# Patient Record
Sex: Male | Born: 2005 | Hispanic: No | Marital: Single | Smoking: Never smoker
Health system: Southern US, Community
[De-identification: ages and names within clinical notes are randomized; demographics above are authoritative.]

## PROBLEM LIST (undated history)

## (undated) DIAGNOSIS — T7840XA Allergy, unspecified, initial encounter: Secondary | ICD-10-CM

## (undated) DIAGNOSIS — F419 Anxiety disorder, unspecified: Secondary | ICD-10-CM

## (undated) DIAGNOSIS — F840 Autistic disorder: Secondary | ICD-10-CM

## (undated) DIAGNOSIS — F909 Attention-deficit hyperactivity disorder, unspecified type: Secondary | ICD-10-CM

## (undated) DIAGNOSIS — K2960 Other gastritis without bleeding: Secondary | ICD-10-CM

## (undated) DIAGNOSIS — K589 Irritable bowel syndrome without diarrhea: Secondary | ICD-10-CM

## (undated) HISTORY — DX: Anxiety disorder, unspecified: F41.9

## (undated) HISTORY — DX: Irritable bowel syndrome, unspecified: K58.9

## (undated) HISTORY — PX: TONSILLECTOMY: SUR1361

## (undated) HISTORY — DX: Other gastritis without bleeding: K29.60

---
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
## 2023-01-02 ENCOUNTER — Encounter: Payer: Self-pay | Admitting: Cardiology

## 2023-01-02 ENCOUNTER — Ambulatory Visit (INDEPENDENT_AMBULATORY_CARE_PROVIDER_SITE_OTHER): Payer: MEDICAID

## 2023-01-02 ENCOUNTER — Ambulatory Visit: Payer: MEDICAID | Attending: Physician Assistant | Admitting: Cardiology

## 2023-01-02 VITALS — BP 128/76 | HR 63 | Ht 69.0 in | Wt 147.4 lb

## 2023-01-02 DIAGNOSIS — F17290 Nicotine dependence, other tobacco product, uncomplicated: Secondary | ICD-10-CM

## 2023-01-02 DIAGNOSIS — R002 Palpitations: Secondary | ICD-10-CM

## 2023-01-02 DIAGNOSIS — R079 Chest pain, unspecified: Secondary | ICD-10-CM

## 2023-01-02 NOTE — Patient Instructions (Signed)
Medication Instructions:  Continue same medications *If you need a refill on your cardiac medications before your next appointment, please call your pharmacy*   Lab Work: None ordered   Testing/Procedures: 7 day Heart Monitor  ( ZIO )   Hold Metoprolol while wearing monitor   Follow-Up: At [facility], you and your health needs are our priority.  As part of our continuing mission to provide you with exceptional heart care, we have created designated Provider Care Teams.  These Care Teams include your primary Cardiologist (physician) and Advanced Practice Providers (APPs -  Physician Assistants and Nurse Practitioners) who all work together to provide you with the care you need, when you need it.  We recommend signing up for the patient portal called "MyChart".  Sign up information is provided on this After Visit Summary.  MyChart is used to connect with patients for Virtual Visits (Telemedicine).  Patients are able to view lab/test results, encounter notes, upcoming appointments, etc.  Non-urgent messages can be sent to your provider as well.   To learn more about what you can do with MyChart, go to [email or web address].    Your next appointment:  6 months     Call in Nov to schedule Feb appointment    Provider:  Dr.Schumann

## 2023-01-02 NOTE — Progress Notes (Signed)
Cardiology Office Note:    Date:  01/02/2023   ID:  Ricardo Hopkins, DOB Jun 30, 2005, MRN [id number]  PCP:  Lianne Moris, PA-C  Cardiologist:  None  Electrophysiologist:  None   Referring MD: Lianne Moris, PA-C   No chief complaint on file.   History of Present Illness:    Ricardo Hopkins is a 17 y.o. male with a hx of ADHD, anxiety, IBS who presents for follow-up.  He was seen in the ED on 12/18/2021 with palpitations.  Occurred after smoking marijuana.  ED work-up unremarkable.  He reports his heart rate was up to 180s.  States that he had palpitations that lasted for about 30 minutes.  Reports palpitations occur occasionally.  Also describes chest pain, he describes as left-sided sharp pain, that lasts for about 1 second and can occur multiple times per day.  No clear cause.  Does report worse with taking deep breaths.  Does not exercise but reports shortness of breath with exertion.  Reports lightheadedness but denies any syncope.  No lower extremity edema.  Reports that he vapes.  Family history includes brother had SVT ablation in 62s.  Echocardiogram 12/2021 showed EF 50 to 55%, normal RV function, no significant valvular disease.  Zio patch x 14 days 12/2021 showed no significant arrhythmias.  Since last clinic visit, he was in the ED 11/2022 with chest pain.  Workup including troponins were negative.  Reports has been having episodes every day where he feels squeezing pain on left side of chest and radiates to the jaw.  Also has palpitations during episodes.  Happens at rest, not related to exercise.  Reports sometimes he will work out doing push-ups and denies any exertional symptoms.  Reports symptoms are worse than when he wore his monitor last year.  PCP started on metoprolol, states that it has helped some.  He continues to vape.   Past Medical History:  Diagnosis Date   ADHD    Allergy    Anxiety    panic attacks   Autism    IBS (irritable bowel syndrome)    Reflux  gastritis     Past Surgical History:  Procedure Laterality Date   TONSILLECTOMY      Current Medications: Current Meds  Medication Sig   metoprolol tartrate (LOPRESSOR) 25 MG tablet Take 12.5 mg by mouth 2 (two) times daily.     Allergies:   Betadine [povidone iodine]   Social History   Socioeconomic History   Marital status: Single    Spouse name: Not on file   Number of children: Not on file   Years of education: Not on file   Highest education level: Not on file  Occupational History   Not on file  Tobacco Use   Smoking status: Never    Passive exposure: Yes   Smokeless tobacco: Never  Vaping Use   Vaping status: Some Days  Substance and Sexual Activity   Alcohol use: No   Drug use: No   Sexual activity: Never  Other Topics Concern   Not on file  Social History Narrative   Not on file   Social Determinants of Health   Financial Resource Strain: Not on file  Food Insecurity: Not on file  Transportation Needs: Not on file  Physical Activity: Not on file  Stress: Not on file  Social Connections: Not on file     Family History: The patient's family history includes ADD / ADHD in an other family member; Aneurysm in his  paternal grandmother; Autism in his brother and brother; Cancer in his maternal grandfather; Depression in his maternal grandmother, mother, and another family member; Diabetes in his mother; Mental retardation in his brother and brother; Supraventricular tachycardia in an other family member.  ROS:   Please see the history of present illness.     All other systems reviewed and are negative.  EKGs/Labs/Other Studies Reviewed:    The following studies were reviewed today:   EKG:   01/06/2022: Normal sinus rhythm, rate 89, no ST abnormalities  Recent Labs: 12/15/2022: TSH 1.512 12/24/2022: BUN 9; Creatinine, Ser 0.84; Hemoglobin 15.6; Platelets 346; Potassium 3.6; Sodium 140  Recent Lipid Panel No results found for: "CHOL", "TRIG", "HDL",  "CHOLHDL", "VLDL", "LDLCALC", "LDLDIRECT"  Physical Exam:    VS:  BP 128/76   Pulse 63   Ht 5\' 9"  (1.753 m)   Wt 147 lb 6.4 oz (66.9 kg)   SpO2 97%   BMI 21.77 kg/m     Wt Readings from Last 3 Encounters:  01/02/23 147 lb 6.4 oz (66.9 kg) (39%, Z= -0.28)*  12/24/22 145 lb (65.8 kg) (35%, Z= -0.39)*  12/15/22 145 lb (65.8 kg) (35%, Z= -0.38)*   * Growth percentiles are based on CDC (Boys, 2-20 Years) data.     GEN:  Well nourished, well developed in no acute distress HEENT: Normal NECK: No JVD; No carotid bruits LYMPHATICS: No lymphadenopathy CARDIAC: RRR, no murmurs, rubs, gallops RESPIRATORY:  Clear to auscultation without rales, wheezing or rhonchi  ABDOMEN: Soft, non-tender, non-distended MUSCULOSKELETAL:  No edema; No deformity  SKIN: Warm and dry NEUROLOGIC:  Alert and oriented x 3 PSYCHIATRIC:  Normal affect   ASSESSMENT:    1. Palpitations   2. Chest pain of uncertain etiology   3. Vaping nicotine dependence, tobacco product     PLAN:    Palpitations: Echocardiogram 12/2021 showed EF 50 to 55%, normal RV function, no significant valvular disease.  Zio patch x 14 days 12/2021 showed no significant arrhythmias. -Having worsening symptoms recently, will repeat Zio patch x 7 days  Chest pain: Low suspicion for cardiac etiology given age and lack of risk factors, and description of pain with no relationship with exertion.  Does report pleuritic component, ESR/CRP were normal.  No further cardiac workup recommended at this time  Vaping: Recommend cessation  RTC in 6 months   Medication Adjustments/Labs and Tests Ordered: Current medicines are reviewed at length with the patient today.  Concerns regarding medicines are outlined above.  Orders Placed This Encounter  Procedures   LONG TERM MONITOR (3-14 DAYS)   No orders of the defined types were placed in this encounter.   Patient Instructions  Medication Instructions:  Continue same medications *If you  need a refill on your cardiac medications before your next appointment, please call your pharmacy*   Lab Work: None ordered   Testing/Procedures: 7 day Heart Monitor  ( ZIO )   Hold Metoprolol while wearing monitor   Follow-Up: At [facility], you and your health needs are our priority.  As part of our continuing mission to provide you with exceptional heart care, we have created designated Provider Care Teams.  These Care Teams include your primary Cardiologist (physician) and Advanced Practice Providers (APPs -  Physician Assistants and Nurse Practitioners) who all work together to provide you with the care you need, when you need it.  We recommend signing up for the patient portal called "MyChart".  Sign up information is provided on this  After Visit Summary.  MyChart is used to connect with patients for Virtual Visits (Telemedicine).  Patients are able to view lab/test results, encounter notes, upcoming appointments, etc.  Non-urgent messages can be sent to your provider as well.   To learn more about what you can do with MyChart, go to [email or web address].    Your next appointment:  6 months     Call in Nov to schedule Feb appointment    Provider:  Dr.Nesha Counihan     Signed, Little Ishikawa, MD  01/02/2023 9:05 AM    [facility]

## 2023-01-02 NOTE — Progress Notes (Unsigned)
Enrolled patient for a 7 day Zio XT monitor to be mailed to patients home.  

## 2023-01-08 DIAGNOSIS — R002 Palpitations: Secondary | ICD-10-CM

## 2023-01-15 ENCOUNTER — Ambulatory Visit: Payer: MEDICAID | Admitting: Physician Assistant

## 2023-02-19 ENCOUNTER — Encounter: Payer: Self-pay | Admitting: Internal Medicine

## 2023-02-19 ENCOUNTER — Ambulatory Visit (INDEPENDENT_AMBULATORY_CARE_PROVIDER_SITE_OTHER): Payer: MEDICAID | Admitting: Internal Medicine

## 2023-02-19 VITALS — BP 123/81 | HR 81 | Ht 69.0 in | Wt 146.0 lb

## 2023-02-19 DIAGNOSIS — R0789 Other chest pain: Secondary | ICD-10-CM | POA: Diagnosis not present

## 2023-02-19 DIAGNOSIS — R0609 Other forms of dyspnea: Secondary | ICD-10-CM

## 2023-02-19 NOTE — Patient Instructions (Signed)
Classic  pain pattern suggests ibs:   migratory with a very limited distribution of pain locations, daytime, not usually exacerbated by exercise  or coughing, worse in sitting position, not as likely to be present supine due to the dome effect of the diaphragm which  is  canceled in that position. Frequently these patients have had multiple negative GI workups and CT scans.  Treatment consists of avoiding foods that cause gas (especially boiled eggs, mexcican food but especially  beans and undercooked vegetables like  spinach and salads)  and citrucel 1 heaping tbsp twice daily with a large glass of water.  Pain should improve w/in 2 weeks and if not then consider further work up.     To get the most out of exercise, you need to be continuously aware that you are short of breath, but never out of breath, for at least 30 minutes daily. As you improve, it will actually be easier for you to do the same amount of exercise  in  30 minutes so always push to the level where you are short of breath.  Once you can do this, push for longer duration.   Follow up is as needed in 6 weeks

## 2023-02-19 NOTE — Progress Notes (Signed)
Kaynin Brazill, male    DOB: 15-Jul-2005    MRN: [id number]   Brief patient profile:  17   yo male stopped vaping aug 2024 very healthy until COVID 2021 with sob/cough/ palpitations > eval neg by cards with atypical cp  referred to pulmonary clinic in [facility]  02/19/2023 by Lianne Moris  for cp and doe.   History of Present Illness  02/19/2023  Pulmonary/ 1st office eval/ Peniel Biel / [place] [facility]  Chief Complaint  Patient presents with   Establish Care  Dyspnea:  maybe jogging 5-10 min no assoc cp  Cough: none  Sleep: bed is flat / one pillow /  SABA use: none / didn't help  02: none CP is always anterior most commonly above L breast, from secs to 15 min max, not brought on by cough ,deep breath or ex and more likely sitting and not present supine x ever since covid.  No obvious day to day or daytime pattern/variability or assoc excess/ purulent sputum or mucus plugs or hemoptysis or   subjective wheeze or overt sinus or hb symptoms.    Also denies any obvious fluctuation of symptoms with weather or environmental changes or other aggravating or alleviating factors except as outlined above   No unusual exposure hx or h/o childhood pna/ asthma   Current Allergies, Complete Past Medical History, Past Surgical History, Family History, and Social History were reviewed in [employer] record.  ROS  The following are not active complaints unless bolded Hoarseness, sore throat, dysphagia, dental problems, itching, sneezing,  nasal congestion or discharge of excess mucus or purulent secretions, ear ache,   fever, chills, sweats, unintended wt loss or wt gain, classically pleuritic or exertional cp,  orthopnea pnd or arm/hand swelling  or leg swelling, presyncope, palpitations, abdominal pain, anorexia, nausea, vomiting, diarrhea  or change in bowel habits or change in bladder habits, change in stools or change in urine, dysuria, hematuria,  rash, arthralgias, visual  complaints, headache, numbness, weakness or ataxia or problems with walking or coordination,  change in mood or  memory.            Outpatient Medications Prior to Visit  Medication Sig Dispense Refill   sertraline (ZOLOFT) 25 MG tablet Take 25 mg by mouth daily.     metoprolol tartrate (LOPRESSOR) 25 MG tablet Take 12.5 mg by mouth 2 (two) times daily.     famotidine (PEPCID) 20 MG tablet Take 1 tablet (20 mg total) by mouth 2 (two) times daily. (Patient not taking: Reported on 01/06/2022) 30 tablet 0   FLUoxetine (PROZAC) 20 MG capsule Take 1 capsule (20 mg total) by mouth daily. (Patient not taking: Reported on 01/06/2022) 30 capsule 0   hydrOXYzine (ATARAX/VISTARIL) 25 MG tablet Take 1 tablet (25 mg total) by mouth daily as needed for anxiety. (Patient not taking: Reported on 01/06/2022) 30 tablet 0   melatonin 3 MG TABS tablet Take 3 mg by mouth at bedtime. (Patient not taking: Reported on 01/06/2022)     naproxen (NAPROSYN) 500 MG tablet Take 1 tablet (500 mg total) by mouth 2 (two) times daily. (Patient not taking: Reported on 01/02/2023) 30 tablet 0   omeprazole (PRILOSEC) 20 MG capsule Take 1 capsule (20 mg total) by mouth daily. (Patient not taking: Reported on 01/02/2023) 30 capsule 0   Probiotic Product ([employer] ADV PROBIOTIC GUMMIES PO) Take 2 each by mouth daily. (Patient not taking: Reported on 01/06/2022)     sertraline (ZOLOFT) 50 MG  tablet Take 50 mg by mouth daily. (Patient not taking: Reported on 01/06/2022)     No facility-administered medications prior to visit.    Past Medical History:  Diagnosis Date   ADHD    Allergy    Anxiety    panic attacks   Autism    IBS (irritable bowel syndrome)    Reflux gastritis      Lab Results  Component Value Date   HGB 15.6 12/24/2022   HGB 15.3 12/15/2022   HGB 14.3 08/12/2019    D dierm      < 0.27    12/25/22     Lab Results  Component Value Date   ESRSEDRATE 2 01/06/2022    Lab Results  Component Value Date   TSH 1.512  12/15/2022     Objective:     BP 123/81   Pulse 81   Ht 5\' 9"  (1.753 m)   Wt 146 lb (66.2 kg)   SpO2 99%   BMI 21.56 kg/m   SpO2: 99 % RA   Pleasant amb male nad   HEENT : Oropharynx  clear      Nasal turbinates nl    NECK :  without  apparent JVD/ palpable Nodes/TM    LUNGS: no acc muscle use,  Nl contour chest which is clear to A and P bilaterally without cough on insp or exp maneuvers   CV:  RRR  no s3 or murmur or increase in P2, and no edema   ABD:  soft and nontender with nl inspiratory excursion in the supine position. No bruits or organomegaly appreciated   MS:  Nl gait/ ext warm without deformities Or obvious joint restrictions  calf tenderness, cyanosis or clubbing    SKIN: warm and dry without lesions    NEURO:  alert, approp, nl sensorium with  no motor or cerebellar deficits apparent.     I personally reviewed images and agree with radiology impression as follows:  CXR:   12/24/22 No active cardiopulmonary disease.     Assessment   DOE (dyspnea on exertion) Onset with covid 2021  - Echo nl 01/14/22  - stopped vaping Aug 2024  - 02/19/2023   Walked on RA  x  3  lap(s) =  approx 450  ft  @ mod fast  pace, stopped due to end of study with lowest 02 sats 92% and no sob  with sats improving to 93% prior to stopping  Ex sats are a bit low for his age and I suppose he could have mild subclinical post covid PF or unusual vape assoc ILD  so rec:  To get the most out of exercise, you need to be continuously aware that you are short of breath, but never out of breath, for at least 30 minutes daily. As you improve, it will actually be easier for you to do the same amount of exercise  in  30 minutes so always push to the level where you are short of breath.  Once you can do this, push for longer duration.  Make sure you check your oxygen saturations at highest level of activity    F/u in 6 weeks if not improving/  consider HRCT chest if not and continue off  vapes in meantime.  Atypical chest pain Onset with covid 2021 with classic IBS pattern of migrating ant cp always sitting, not with ex or supine - Rx for IBS  02/19/2023 >>>  Classic  pain pattern suggests ibs:  Stereotypical,  migratory with a very limited distribution of pain locations, daytime, not usually exacerbated by exercise  or coughing, worse in sitting position, frequently associated with generalized abd bloating, not as likely to be present supine due to the dome effect of the diaphragm which  is  canceled in that position. Frequently these patients have had multiple negative GI workups and CT scans.  Treatment consists of avoiding foods that cause gas (especially boiled eggs, mexcican food but especially  beans and undercooked vegetables like  spinach and some salads)  and citrucel 1 heaping tsp twice daily with a large glass of water.  Pain should improve w/in 2 weeks and if not then consider further  work up.     >>> f/u in 6 weeks if not better.   Discussed in detail all the  indications, usual  risks and alternatives  relative to the benefits with patient who agrees to proceed with Rx as outlined.      Each maintenance medication was reviewed in detail including emphasizing most importantly the difference between maintenance and prns and under what circumstances the prns are to be triggered using an action plan format where appropriate.  Total time for H and P, chart review, counseling,  directly observing portions of ambulatory 02 saturation study/ and generating customized AVS unique to this office visit / same day charting > 60 min for new pt with multiple chronic  refractory chest   symptoms of uncertain etiology                    Sandrea Hughs, MD 02/19/2023

## 2023-02-20 ENCOUNTER — Encounter: Payer: Self-pay | Admitting: Internal Medicine

## 2023-02-20 DIAGNOSIS — R0609 Other forms of dyspnea: Secondary | ICD-10-CM | POA: Insufficient documentation

## 2023-02-20 NOTE — Assessment & Plan Note (Addendum)
Onset with covid 2021  - Echo nl 01/14/22  - stopped vaping Aug 2024  - 02/19/2023   Walked on RA  x  3  lap(s) =  approx 450  ft  @ mod fast  pace, stopped due to end of study with lowest 02 sats 92% and no sob  with sats improving to 93% prior to stopping  Ex sats are a bit low for his age and I suppose he could have mild subclinical post covid PF or unusual vape assoc ILD  so rec:  To get the most out of exercise, you need to be continuously aware that you are short of breath, but never out of breath, for at least 30 minutes daily. As you improve, it will actually be easier for you to do the same amount of exercise  in  30 minutes so always push to the level where you are short of breath.  Once you can do this, push for longer duration.  Make sure you check your oxygen saturations at highest level of activity    F/u in 6 weeks if not improving/  consider HRCT chest if not and continue off vapes in meantime.

## 2023-02-20 NOTE — Assessment & Plan Note (Addendum)
Onset with covid 2021 with classic IBS pattern of migrating ant cp always sitting, not with ex or supine - Rx for IBS  02/19/2023 >>>  Classic  pain pattern suggests ibs:  Stereotypical, migratory with a very limited distribution of pain locations, daytime, not usually exacerbated by exercise  or coughing, worse in sitting position, frequently associated with generalized abd bloating, not as likely to be present supine due to the dome effect of the diaphragm which  is  canceled in that position. Frequently these patients have had multiple negative GI workups and CT scans.  Treatment consists of avoiding foods that cause gas (especially boiled eggs, mexcican food but especially  beans and undercooked vegetables like  spinach and some salads)  and citrucel 1 heaping tsp twice daily with a large glass of water.  Pain should improve w/in 2 weeks and if not then consider further  work up.     >>> f/u in 6 weeks if not better.   Discussed in detail all the  indications, usual  risks and alternatives  relative to the benefits with patient who agrees to proceed with Rx as outlined.      Each maintenance medication was reviewed in detail including emphasizing most importantly the difference between maintenance and prns and under what circumstances the prns are to be triggered using an action plan format where appropriate.  Total time for H and P, chart review, counseling,  directly observing portions of ambulatory 02 saturation study/ and generating customized AVS unique to this office visit / same day charting > 60 min for new pt with multiple chronic  refractory chest   symptoms of uncertain etiology

## 2023-03-08 NOTE — Progress Notes (Unsigned)
Cardiology Office Note    Date:  03/11/2023  ID:  Ricardo Hopkins, DOB Oct 08, 2005, MRN [id number] PCP:  Ricardo Moris, PA-C  Cardiologist:  Ricardo Ishikawa, MD  Electrophysiologist:  None   Chief Complaint: Follow up for palpitations   History of Present Illness: .    Ricardo Hopkins is a 17 y.o. male with visit-pertinent history of ADHD, anxiety, IBS, palpitations and atypical chest pain.  First seen by Dr. Gaynelle Hopkins on 01/06/2022 for ED follow-up for palpitations.  ED workup was unremarkable.  He reported having palpitations after smoking marijuana, reported his heart rate was up into the 180s and lasted for about 30 minutes.  He reported that he did occasionally have palpitations.  He also described chest pain that was left-sided and sharp lasting for about 1 second and could occur multiple times a day.  Echo on 01/14/2022 indicated LVEF 50 to 55%, no RWMA, diastolic parameters were normal, he had mild tricuspid valve regurgitation.  No other significant valvular abnormalities.  He wore a cardiac monitor in 12/2021 that showed an average heart rate of 91 bpm, ranging from 31 to 199 bpm.  Predominant underlying rhythm was sinus rhythm, second-degree AV block type I was present, isolated SVE's were rare, SVE couplets were rare.  There were 34 patient triggered events, corresponding with sinus rhythm and PACs.   He presented again to the ED in 11/2022 with chest pain, workup including troponins were negative.  He was seen in clinic by Ricardo Hopkins, patient reporting having episodes daily of squeezing left-sided chest pain that radiated into the jaw.  He also reported palpitations during these episodes.  Would happen at rest not related to exercise.  He noted that his symptoms were worse than when he wore his monitor the year prior, his PCP had started him on metoprolol which helped some.  It was noted that he continued to vape.  Repeat cardiac monitor that was worn for 7 days showed an  average heart rate of 80 bpm, ranging from 46 to 178 bpm.  Predominant underlying rhythm was sinus rhythm, SVE, SVE triplets were rare, no SVE couplets were present, isolated VE's were rare.  There were 104 patient triggered events, corresponding with sinus rhythm and PACs.  He was seen by Dr. Sherene Hopkins on 02/19/2023 after referral from his PCP.  It was noted that he had COVID in 2021, stopped vaping in August 2024.  He did a walk test walking 450 feet at a moderately fast pace, stopped due to end of study with lowest oxygen saturation at 92% and no shortness of breath with saturations improving to 93% prior to stopping.  It was noted that his sats are a bit low for his age, could possibly have mild subclinical post COVID PF or unusual vape associated ILD.  It was recommended that he increase his activity as tolerated and to monitor oxygen saturation at highest level of activity. On chart review he also had an exercise treadmill test showed no significant ST segment changes during stress or recovery, no arrhythmias were noted, ECG was negative for ischemia. He did have poor exercise capacity for his age, reached peak heart rate of 181 bpm.   Today he presents for follow up. He reports that his shortness of breath and palpitations have improved since he stopped vaping a few weeks ago. He does endorse ongoing intermittent left chest chest pain and arm pain, not associated with exertion and is overall unchanged. He reports new intermittent facial numbness,  hand and feet numbness with occasional tingling and episodes of brain fog.  He reports that he is no longer on antianxiety medication as he felt like it was not beneficial.   Labwork independently reviewed: 12/24/2022: Sodium 140, potassium 3.6, creatinine 0.84, high-sensitivity troponin less than 2, hemoglobin 15.6, hematocrit 44.3 ROS: .   Today he denies lower extremity edema, fatigue, melena, hematuria, hemoptysis, diaphoresis, weakness, presyncope, syncope,  orthopnea, and PND.  All other systems are reviewed and otherwise negative. Studies Reviewed: [name] Kitchen   EKG:  EKG is not ordered today.  CV Studies:  Cardiac Studies & Procedures       ECHOCARDIOGRAM  ECHOCARDIOGRAM COMPLETE 01/14/2022  Narrative ECHOCARDIOGRAM REPORT    Patient Name:   Ricardo Hopkins Date of Exam: 01/14/2022 Medical Rec #:  [id number]           Height:       69.0 in Accession #:    [id number]          Weight:       138.8 lb Date of Birth:  09/01/2005           BSA:          1.769 m Patient Age:    16 years            BP:           144/86 mmHg Patient Gender: M                   HR:           78 bpm. Exam Location:  [facility]  Procedure: 2D Echo, Cardiac Doppler and Color Doppler  Indications:    R00.2 (ICD-10-CM) - Palpitations  History:        Patient has no prior history of Echocardiogram examinations. Risk Factors:Vapes. Hx of COVID-19.  Sonographer:    Celesta Gentile RCS Referring Phys: [id number] Ricardo Hopkins  IMPRESSIONS   1. Left ventricular ejection fraction, by estimation, is 50 to 55%. The left ventricle has low normal function. The left ventricle has no regional wall motion abnormalities. Left ventricular diastolic parameters were normal. 2. Right ventricular systolic function is normal. The right ventricular size is normal. There is normal pulmonary artery systolic pressure. 3. The mitral valve is normal in structure. No evidence of mitral valve regurgitation. No evidence of mitral stenosis. 4. The tricuspid valve is abnormal. 5. The aortic valve is tricuspid. Aortic valve regurgitation is not visualized. No aortic stenosis is present. 6. The inferior vena cava is normal in size with greater than 50% respiratory variability, suggesting right atrial pressure of 3 mmHg.  FINDINGS Left Ventricle: Left ventricular ejection fraction, by estimation, is 50 to 55%. The left ventricle has low normal function. The left ventricle has no regional  wall motion abnormalities. The left ventricular internal cavity size was normal in size. There is no left ventricular hypertrophy. Left ventricular diastolic parameters were normal.  Right Ventricle: The right ventricular size is normal. Right vetricular wall thickness was not well visualized. Right ventricular systolic function is normal. There is normal pulmonary artery systolic pressure. The tricuspid regurgitant velocity is 2.13 m/s, and with an assumed right atrial pressure of 3 mmHg, the estimated right ventricular systolic pressure is 21.1 mmHg.  Left Atrium: Left atrial size was normal in size.  Right Atrium: Right atrial size was normal in size.  Pericardium: There is no evidence of pericardial effusion.  Mitral Valve: The mitral valve is normal in structure.  No evidence of mitral valve regurgitation. No evidence of mitral valve stenosis.  Tricuspid Valve: The tricuspid valve is abnormal. Tricuspid valve regurgitation is mild . No evidence of tricuspid stenosis.  Aortic Valve: The aortic valve is tricuspid. Aortic valve regurgitation is not visualized. No aortic stenosis is present. Aortic valve mean gradient measures 4.0 mmHg. Aortic valve peak gradient measures 7.7 mmHg. Aortic valve area, by VTI measures 3.51 cm.  Pulmonic Valve: The pulmonic valve was not well visualized. Pulmonic valve regurgitation is not visualized. No evidence of pulmonic stenosis.  Aorta: The aortic root is normal in size and structure.  Venous: The inferior vena cava is normal in size with greater than 50% respiratory variability, suggesting right atrial pressure of 3 mmHg.  IAS/Shunts: No atrial level shunt detected by color flow Doppler.   LEFT VENTRICLE PLAX 2D LVIDd:         5.00 cm   Diastology LVIDs:         3.40 cm   LV e' medial:    14.30 cm/s LV PW:         0.80 cm   LV E/e' medial:  6.9 LV IVS:        0.70 cm   LV e' lateral:   17.40 cm/s LVOT diam:     2.30 cm   LV E/e' lateral:  5.7 LV SV:         93 LV SV Index:   53 LVOT Area:     4.15 cm   RIGHT VENTRICLE RV S prime:     12.80 cm/s TAPSE (M-mode): 1.8 cm  LEFT ATRIUM             Index        RIGHT ATRIUM           Index LA diam:        3.50 cm 1.98 cm/m   RA Area:     10.30 cm LA Vol (A2C):   45.0 ml 25.44 ml/m  RA Volume:   19.50 ml  11.02 ml/m LA Vol (A4C):   34.6 ml 19.56 ml/m LA Biplane Vol: 39.7 ml 22.44 ml/m AORTIC VALVE AV Area (Vmax):    3.35 cm AV Area (Vmean):   3.35 cm AV Area (VTI):     3.51 cm AV Vmax:           138.99 cm/s AV Vmean:          93.435 cm/s AV VTI:            0.266 m AV Peak Grad:      7.7 mmHg AV Mean Grad:      4.0 mmHg LVOT Vmax:         112.00 cm/s LVOT Vmean:        75.400 cm/s LVOT VTI:          0.225 m LVOT/AV VTI ratio: 0.84  AORTA Ao Root diam: 3.00 cm  MITRAL VALVE               TRICUSPID VALVE MV Area (PHT): 3.72 cm    TR Peak grad:   18.1 mmHg MV Decel Time: 204 msec    TR Vmax:        213.00 cm/s MV E velocity: 99.00 cm/s MV A velocity: 55.60 cm/s  SHUNTS MV E/A ratio:  1.78        Systemic VTI:  0.22 m Systemic Diam: 2.30 cm  Dina Rich MD Electronically signed by Dina Rich MD  Signature Date/Time: 01/14/2022/12:57:20 PM    Final    MONITORS  LONG TERM MONITOR (3-14 DAYS) 01/21/2023  Narrative   No significant arrhythmias   Patch Wear Time:  7 days and 1 hours (2024-09-12T23:06:49-0400 to 2024-09-20T00:07:38-0400)  Patient had a min HR of 46 bpm, max HR of 178 bpm, and avg HR of 80 bpm. Predominant underlying rhythm was Sinus Rhythm. Isolated SVEs were rare (<1.0%), SVE Triplets were rare (<1.0%), and no SVE Couplets were present. Isolated VEs were rare (<1.0%), and no VE Couplets or VE Triplets were present.  104 patient triggered events, corresponding sinus rhythm  PACs            Current Reported Medications:.    No outpatient medications have been marked as taking for the 03/11/23 encounter (Office Visit) with  Rip Harbour, NP.   Physical Exam:    VS:  BP 102/60 (BP Location: Left Arm, Patient Position: Sitting, Cuff Size: Normal)   Pulse 68   Ht 5\' 9"  (1.753 m)   Wt 148 lb (67.1 kg)   SpO2 96%   BMI 21.86 kg/m    Wt Readings from Last 3 Encounters:  03/11/23 148 lb (67.1 kg) (39%, Z= -0.28)*  02/19/23 146 lb (66.2 kg) (36%, Z= -0.36)*  01/02/23 147 lb 6.4 oz (66.9 kg) (39%, Z= -0.28)*   * Growth percentiles are based on CDC (Boys, 2-20 Years) data.    GEN: Well nourished, well developed in no acute distress NECK: No JVD; No carotid bruits CARDIAC: RRR, no murmurs, rubs, gallops RESPIRATORY:  Clear to auscultation without rales, wheezing or rhonchi  ABDOMEN: Soft, non-tender, non-distended EXTREMITIES:  No edema; No acute deformity   Asessement and Plan:.    Palpitations/shortness of breath: Cardiac monitor in 12/2022 that was worn for 7 days showed an average heart rate of 80 bpm, ranging from 46 to 178 bpm.  Predominant underlying rhythm was sinus rhythm, SVE, SVE triplets were rare, no SVE couplets were present, isolated VE's were rare.  There were 104 patient triggered events, corresponding with sinus rhythm and PACs. He has been seen by pulmonary, oxygen saturation was slightly low for age, felt likely related to vaping, encouraged to increased activity as tolerated. Today patient reports he has had improvement in his breathing and palpitations since he stopped vaping a few weeks ago. He was previously started on metoprolol tartrate 12.5 mg twice daily by his PCP, noted improvement, continued. Congratulated on cessation.   Atypical Chest pain/facial numbness/extremity numbness and tingling: Patient has previously noted atypical chest pain that is ongoing. On chart review patient had an exercise stress test at [facility] on 02/19/2023, no evidence of ischemia or arrhythmia. He had low exercise tolerance for his age. He notes ongoing intermittent chest and arm pain, not associated with  exertion. He also reports in the last few weeks he started having intermittent facial numbness as well as numbness in his hands and feet with occasional tingling, he questions if possibly nerve related. He notes that he had discussed possible neurology referral with his PCP, she recommended following with cardiology first, then neurology.  As his cardiac workup has been reassuring and with new symptoms of intermittent numbness and tingling will refer to neurology.  Vaping: Has stopped vaping, palpitations and shortness of breath have improved since discontinuation. Congratulated on cessation.   Disposition: F/u with Ricardo Hopkins in March, 2025 (pt request).   Signed, Rip Harbour, NP

## 2023-03-11 ENCOUNTER — Encounter: Payer: Self-pay | Admitting: Cardiology

## 2023-03-11 ENCOUNTER — Ambulatory Visit: Payer: MEDICAID | Attending: Cardiology | Admitting: Cardiology

## 2023-03-11 VITALS — BP 102/60 | HR 68 | Ht 69.0 in | Wt 148.0 lb

## 2023-03-11 DIAGNOSIS — R002 Palpitations: Secondary | ICD-10-CM | POA: Diagnosis not present

## 2023-03-11 DIAGNOSIS — F17290 Nicotine dependence, other tobacco product, uncomplicated: Secondary | ICD-10-CM | POA: Diagnosis not present

## 2023-03-11 DIAGNOSIS — R0789 Other chest pain: Secondary | ICD-10-CM

## 2023-03-11 DIAGNOSIS — R079 Chest pain, unspecified: Secondary | ICD-10-CM

## 2023-03-11 DIAGNOSIS — R200 Anesthesia of skin: Secondary | ICD-10-CM | POA: Diagnosis not present

## 2023-03-11 DIAGNOSIS — R202 Paresthesia of skin: Secondary | ICD-10-CM

## 2023-03-11 NOTE — Patient Instructions (Signed)
Medication Instructions:  No changes *If you need a refill on your cardiac medications before your next appointment, please call your pharmacy*  Lab Work: No labs  Testing/Procedures: No testing  Follow-Up: At [facility], you and your health needs are our priority.  As part of our continuing mission to provide you with exceptional heart care, we have created designated Provider Care Teams.  These Care Teams include your primary Cardiologist (physician) and Advanced Practice Providers (APPs -  Physician Assistants and Nurse Practitioners) who all work together to provide you with the care you need, when you need it.  We recommend signing up for the patient portal called "MyChart".  Sign up information is provided on this After Visit Summary.  MyChart is used to connect with patients for Virtual Visits (Telemedicine).  Patients are able to view lab/test results, encounter notes, upcoming appointments, etc.  Non-urgent messages can be sent to your provider as well.   To learn more about what you can do with MyChart, go to [email or web address].    Your next appointment:   4 month(s)  Provider:   Little Ishikawa, MD   Other Instructions We will send a referral out to neurology.

## 2023-03-30 ENCOUNTER — Encounter: Payer: Self-pay | Admitting: Neurology

## 2023-04-01 NOTE — Progress Notes (Signed)
Ricardo Hopkins, male    DOB: Dec 11, 2005    MRN: [id number]   Brief patient profile:  17   yo male stopped vaping aug 2024 very healthy until COVID 2021 with sob/cough/ palpitations > eval neg by cards with atypical cp  referred to pulmonary clinic in [facility]  02/19/2023 by Lianne Moris  for cp and doe.   History of Present Illness  02/19/2023  Pulmonary/ 1st office eval/ Cristobal Advani / [place] [facility]  Chief Complaint  Patient presents with   Establish Care  Dyspnea:  maybe jogging 5-10 min no assoc cp  Cough: none  Sleep: bed is flat / one pillow /  SABA use: none / didn't help  02: none CP is always anterior most commonly above L breast, from secs to 15 min max, not brought on by cough ,deep breath or ex and more likely sitting and not present supine x ever since covid. Rec Classic  pain pattern suggests ibs:     citrucel 1 heaping tbsp twice daily with a large glass of water.       To get the most out of exercise, you need to be continuously aware that you are short of breath, but never out of breath, for at least 30 minutes daily Follow up is as needed in 6 weeks    04/02/2023  f/u ov/[place] office/Raheen Capili re: ? IBS  maint on no resp medications  Chief Complaint  Patient presents with   Shortness of Breath  Dyspnea:  mostly running outdoor most is  10 min  but says felt fine on treadmill and was apparently stopped when reached target heart rate at 6 min 21 sec s symptoms reported  Cough: sporadic worse on cold air exp/ with ex  Sleeping: bed is flat s  chest cc  SABA use: none  02: none  L arm pain at rest "do you think it could be my heart") Cp daily from 5 - 10 min shortest. Not related to ex Sharolyn Douglas no help, just made heart race Gerd rx no better  Citrucel no better       No obvious other patterns in day to day or daytime variability or assoc excess/ purulent sputum or mucus plugs or hemoptysis or chest tightness, subjective wheeze or overt sinus or hb symptoms.     Also denies any obvious fluctuation of symptoms with weather or environmental changes or other aggravating or alleviating factors except as outlined above   No unusual exposure hx or h/o childhood pna/ asthma or knowledge of premature birth.  Current Allergies, Complete Past Medical History, Past Surgical History, Family History, and Social History were reviewed in [employer] record.  ROS  The following are not active complaints unless bolded Hoarseness, sore throat, dysphagia, dental problems, itching, sneezing,  nasal congestion or discharge of excess mucus or purulent secretions, ear ache,   fever, chills, sweats, unintended wt loss or wt gain, classically pleuritic or exertional cp,  orthopnea pnd or arm/hand swelling  or leg swelling, presyncope, palpitations, abdominal pain, anorexia, nausea, vomiting, diarrhea  or change in bowel habits or change in bladder habits, change in stools or change in urine, dysuria, hematuria,  rash, arthralgias, visual complaints, headache, numbness, weakness or ataxia or problems with walking or coordination,  change in mood or  memory.        Current Meds  Medication Sig   metoprolol tartrate (LOPRESSOR) 25 MG tablet Take 12.5 mg by mouth 2 (two) times daily.  Past Medical History:  Diagnosis Date   ADHD    Allergy    Anxiety    panic attacks   Autism    IBS (irritable bowel syndrome)    Reflux gastritis            Objective:    Wt Readings from Last 3 Encounters:  04/02/23 152 lb (68.9 kg) (45%, Z= -0.12)*  03/11/23 148 lb (67.1 kg) (39%, Z= -0.28)*  02/19/23 146 lb (66.2 kg) (36%, Z= -0.36)*   * Growth percentiles are based on CDC (Boys, 2-20 Years) data.    Vital signs reviewed  04/02/2023  - Note at rest 02 sats  97% on RA   General appearance:    amb wm focuses on other doctors findings, pulse rate and not symptoms which jump from one to the other before completing response on the prior symptom     HEENT : Oropharynx  clear      Nasal turbinates nl    NECK :  without  apparent JVD/ palpable Nodes/TM    LUNGS: no acc muscle use,  Nl contour chest which is clear to A and P bilaterally without cough on insp or exp maneuvers   CV:  RRR  no s3 or murmur or increase in P2, and no edema   ABD:  soft and nontender with nl inspiratory excursion in the supine position. No bruits or organomegaly appreciated   MS:  Nl gait/ ext warm without deformities Or obvious joint restrictions  calf tenderness, cyanosis or clubbing    SKIN: warm and dry without lesions    NEURO:  alert, approp, nl sensorium with  no motor or cerebellar deficits apparent.       Assessment

## 2023-04-02 ENCOUNTER — Ambulatory Visit (INDEPENDENT_AMBULATORY_CARE_PROVIDER_SITE_OTHER): Payer: MEDICAID | Admitting: Internal Medicine

## 2023-04-02 ENCOUNTER — Encounter: Payer: Self-pay | Admitting: Internal Medicine

## 2023-04-02 VITALS — BP 110/67 | HR 74 | Ht 69.0 in | Wt 152.0 lb

## 2023-04-02 DIAGNOSIS — R0609 Other forms of dyspnea: Secondary | ICD-10-CM

## 2023-04-02 DIAGNOSIS — R0789 Other chest pain: Secondary | ICD-10-CM

## 2023-04-02 NOTE — Patient Instructions (Addendum)
My office will be contacting you by phone for referral to CPST in [place]   - if you don't hear back from my office within one week please call us back or notify us thru MyChart and we'll address it right away.    To get the most out of exercise, you need to be continuously aware that you are short of breath, but never out of breath, for at least 30 minutes daily. As you improve, it will actually be easier for you to do the same amount of exercise  in  30 minutes so always push to the level where you are short of breath.

## 2023-04-03 NOTE — Assessment & Plan Note (Addendum)
Onset with covid 2021  - Echo nl 01/14/22  - stopped vaping Aug 2024  - 02/19/2023   Walked on RA  x  3  lap(s) =  approx 450  ft  @ mod fast  pace, stopped due to end of study with lowest 02 sats 92% and no sob  with sats improving to 93% prior to stopping  Multiple refractory chest symptoms x 2021 with no response to IBS rx since last ov nor prior rx for GERD with the most reproducible = doe so next step is CPST  In meantime strongly urged: To get the most out of exercise, you need to be continuously aware that you are short of breath, but never out of breath, for at least 30 minutes daily. As you improve, it will actually be easier for you to do the same amount of exercise  in  30 minutes so always push to the level where you are short of breath.  ie  needs to learn to pace himself to point where he can ex for 30 min before pushing himself so hard he has to stop  Pulmonary f/u with be prn p review CPST.  Discussed in detail all the  indications, usual  risks and alternatives  relative to the benefits with patient who agrees to proceed with w/u as outlined.            Each maintenance medication was reviewed in detail including emphasizing most importantly the difference between maintenance and prns and under what circumstances the prns are to be triggered using an action plan format where appropriate.  Total time for H and P, chart review, counseling, and generating customized AVS unique to this office visit / same day charting = 23 min

## 2023-05-12 ENCOUNTER — Ambulatory Visit (INDEPENDENT_AMBULATORY_CARE_PROVIDER_SITE_OTHER): Payer: MEDICAID | Admitting: Neurology

## 2023-05-12 ENCOUNTER — Encounter: Payer: Self-pay | Admitting: Neurology

## 2023-05-12 VITALS — BP 124/72 | HR 85 | Ht 69.0 in | Wt 147.0 lb

## 2023-05-12 DIAGNOSIS — R200 Anesthesia of skin: Secondary | ICD-10-CM

## 2023-05-12 DIAGNOSIS — R292 Abnormal reflex: Secondary | ICD-10-CM | POA: Diagnosis not present

## 2023-05-12 DIAGNOSIS — R519 Headache, unspecified: Secondary | ICD-10-CM | POA: Diagnosis not present

## 2023-05-12 DIAGNOSIS — R202 Paresthesia of skin: Secondary | ICD-10-CM | POA: Diagnosis not present

## 2023-05-12 NOTE — Progress Notes (Signed)
 [facility] Clinic Note - Initial Visit   Date: 05/12/2023   Yahsir Wickens MRN: [id number] DOB: [birth year]   Dear Katlyn West, NP:  Thank you for your kind referral of Ricardo Hopkins for consultation of numbness/tingling. Although his history is well known to you, please allow us  to reiterate it for the purpose of our medical record. The patient was accompanied to the clinic by self.    Javion Javohn Lasala is a 18 y.o. right-handed male with ADHD, anxiety, and palpitations presenting for evaluation of left side numbness/tingling.   IMPRESSION/PLAN: Left face, arm, and leg paresthesias with associated headaches.  Exam shows normal CN, cranial nerves, strength, and sensation.  Reflexes are brisk at the patella, which can be seen in normal individuals of his age.  Given his lateralizing symptoms and headaches, I will check MRI brain wwo contrast. Further recommendations pending results.   ------------------------------------------------------------- History of present illness: In 2021, he had COVID and since this time he has intermittent spells of numbness and tingling involving the left arm, leg, and face.  It occurs 3-4 times per day, lasting anywhere from 5-minute to 15-minutes.  No specific triggers.  He also complains of left eye heaviness and throbbing pain which lasts 15-20 minutes.  He does not have nausea or vomiting.  He has some light and noise sensitivity.  He denies similar symptoms on the right side.   He also has difficulty with concentrating and stares off into space.  It lasts 10 seconds. He is aware of this and does not loss consciousness.    He is unemployed.  He lives at home with parents and two autistic siblings.  He does not smoke, drink alcohol, or use drugs.    Out-side paper records, electronic medical record, and images have been reviewed where available and summarized as:  CT head wo contrast 03/16/2020:  No acute  intracranial abnormality  Lab Results  Component Value Date   TSH 1.512 12/15/2022   Lab Results  Component Value Date   ESRSEDRATE 2 01/06/2022    Past Medical History:  Diagnosis Date   ADHD    Allergy    Anxiety    panic attacks   Autism    IBS (irritable bowel syndrome)    Reflux gastritis     Past Surgical History:  Procedure Laterality Date   TONSILLECTOMY       Medications:  Outpatient Encounter Medications as of 05/12/2023  Medication Sig   metoprolol tartrate (LOPRESSOR) 25 MG tablet Take 12.5 mg by mouth 2 (two) times daily.   No facility-administered encounter medications on file as of 05/12/2023.    Allergies:  Allergies  Allergen Reactions   Betadine [Povidone Iodine] Hives    Family History: Family History  Problem Relation Age of Onset   Depression Mother    Diabetes Mother    Autism Brother    Mental retardation Brother    Depression Maternal Grandmother    Cancer Maternal Grandfather    Aneurysm Paternal Grandmother    Mental retardation Brother    Autism Brother    Depression Other    ADD / ADHD Other    Supraventricular tachycardia Other     Social History: Social History   Tobacco Use   Smoking status: Never    Passive exposure: Yes   Smokeless tobacco: Never  Vaping Use   Vaping status: Some Days  Substance Use Topics   Alcohol use: No   Drug use: No  Social History   Social History Narrative   Are you right handed or left handed? Left Handed    Are you currently employed ? No    What is your current occupation?   Do you live at home alone? No   Who lives with you? Mom    What type of home do you live in: 1 story or 2 story? Lives in a one story home        Vital Signs:  BP 124/72   Pulse 85   Ht 5' 9 (1.753 m)   Wt 147 lb (66.7 kg)   SpO2 100%   BMI 21.71 kg/m   Neurological Exam: MENTAL STATUS including orientation to time, place, person, recent and remote memory, attention span and concentration,  language, and fund of knowledge is normal.  Speech is not dysarthric.  CRANIAL NERVES: II:  No visual field defects.     III-IV-VI: Pupils equal round and reactive to light.  Normal conjugate, extra-ocular eye movements in all directions of gaze.  No nystagmus.  No ptosis.   V:  Normal facial sensation.    VII:  Normal facial symmetry and movements.   VIII:  Normal hearing and vestibular function.   IX-X:  Normal palatal movement.   XI:  Normal shoulder shrug and head rotation.   XII:  Normal tongue strength and range of motion, no deviation or fasciculation.  MOTOR:  Motor strength is 5/5 throughout.   No atrophy, fasciculations or abnormal movements.  No pronator drift.   MSRs:                                           Right        Left brachioradialis 2+  2+  biceps 2+  2+  triceps 2+  2+  patellar 3+  3+  ankle jerk 2+  2+  Hoffman no  no  plantar response down  down   SENSORY:  Normal and symmetric perception of light touch, pinprick, vibration, and temperature.  Romberg's sign absent.   COORDINATION/GAIT: Normal finger-to- nose-finger.  Intact rapid alternating movements bilaterally.Gait narrow based and stable. Tandem and stressed gait intact.    Thank you for allowing me to participate in patient's care.  If I can answer any additional questions, I would be pleased to do so.    Sincerely,    Kyrin Gratz K. Tobie, DO

## 2023-05-12 NOTE — Patient Instructions (Signed)
MRI brain wwo contrast. We will call you with the results °

## 2023-05-29 ENCOUNTER — Encounter: Payer: Self-pay | Admitting: Neurology

## 2023-06-14 ENCOUNTER — Inpatient Hospital Stay: Admission: RE | Admit: 2023-06-14 | Payer: MEDICAID | Source: Ambulatory Visit

## 2023-07-30 ENCOUNTER — Telehealth: Payer: Self-pay | Admitting: Internal Medicine

## 2023-07-30 NOTE — Telephone Encounter (Signed)
 Spoke with patient regarding th CPX ordered by Dr. Wert---[facility] does not have staff to complete the study and patient was place on wait list----he voiced his understanding and Dr. Sherene Sires was made aware as well

## 2023-08-04 ENCOUNTER — Encounter: Payer: Self-pay | Admitting: Neurology

## 2023-08-05 ENCOUNTER — Ambulatory Visit
Admission: RE | Admit: 2023-08-05 | Discharge: 2023-08-05 | Disposition: A | Discharge status: Home / Self Care | Payer: MEDICAID | Source: Ambulatory Visit | Attending: Neurology

## 2023-08-05 DIAGNOSIS — R519 Headache, unspecified: Secondary | ICD-10-CM

## 2023-08-05 DIAGNOSIS — R202 Paresthesia of skin: Secondary | ICD-10-CM

## 2023-08-05 DIAGNOSIS — R200 Anesthesia of skin: Secondary | ICD-10-CM

## 2023-08-05 DIAGNOSIS — R292 Abnormal reflex: Secondary | ICD-10-CM

## 2023-08-05 MED ORDER — GADOPICLENOL 0.5 MMOL/ML IV SOLN
6.0000 mL | Freq: Once | INTRAVENOUS | Status: AC | PRN
Start: 2023-08-05 — End: 2023-08-05
  Administered 2023-08-05: 6 mL via INTRAVENOUS

## 2023-10-26 ENCOUNTER — Telehealth (HOSPITAL_COMMUNITY): Payer: Self-pay

## 2023-10-26 NOTE — Telephone Encounter (Signed)
 front office left pt a voice message to call the [facility] to schedule a CPX test ordered by Dr. Ozell America.

## 2024-02-04 ENCOUNTER — Telehealth: Payer: Self-pay | Admitting: Internal Medicine

## 2024-02-04 NOTE — Telephone Encounter (Signed)
 Patient was seen 04/02/23 by Dr. Darlean and a PX was ordered, at that time, we were unable to schedule the testing due to staffing issues.  LVM for patient to call and discuss scheduling

## 2024-02-08 NOTE — Telephone Encounter (Signed)
 Called to discuss scheduling--no answer and mail box is full

## 2024-02-11 NOTE — Telephone Encounter (Signed)
 LVM for patient to call and discuss scheduling the CPS ordered in December, 2024---we now have the capability to schedule

## 2024-02-15 ENCOUNTER — Encounter: Payer: Self-pay | Admitting: Internal Medicine

## 2024-02-15 NOTE — Telephone Encounter (Signed)
 Letter mailed requesting patient call to discuss scheudling
